# Patient Record
Sex: Male | Born: 1990 | Race: Black or African American | Hispanic: No | Marital: Single | Smoking: Never smoker
Health system: Southern US, Community
[De-identification: ages and names within clinical notes are randomized; demographics above are authoritative.]

## PROBLEM LIST (undated history)

## (undated) DIAGNOSIS — R7303 Prediabetes: Secondary | ICD-10-CM

## (undated) DIAGNOSIS — F909 Attention-deficit hyperactivity disorder, unspecified type: Secondary | ICD-10-CM

## (undated) DIAGNOSIS — F640 Transsexualism: Secondary | ICD-10-CM

## (undated) DIAGNOSIS — IMO0001 Reserved for inherently not codable concepts without codable children: Secondary | ICD-10-CM

## (undated) DIAGNOSIS — Z79899 Other long term (current) drug therapy: Secondary | ICD-10-CM

## (undated) HISTORY — PX: ANTERIOR CRUCIATE LIGAMENT REPAIR: SHX115

## (undated) HISTORY — DX: Attention-deficit hyperactivity disorder, unspecified type: F90.9

## (undated) HISTORY — DX: Morbid (severe) obesity due to excess calories: E66.01

## (undated) HISTORY — DX: Prediabetes: R73.03

## (undated) HISTORY — DX: Reserved for inherently not codable concepts without codable children: IMO0001

## (undated) HISTORY — DX: Other long term (current) drug therapy: Z79.899

## (undated) HISTORY — DX: Transsexualism: F64.0

---

## 2019-10-06 ENCOUNTER — Other Ambulatory Visit: Payer: Self-pay

## 2019-10-06 ENCOUNTER — Encounter: Payer: Self-pay | Admitting: Family Medicine

## 2019-10-06 ENCOUNTER — Ambulatory Visit (INDEPENDENT_AMBULATORY_CARE_PROVIDER_SITE_OTHER): Payer: Managed Care, Other (non HMO) | Admitting: Family Medicine

## 2019-10-06 VITALS — BP 110/70 | HR 94 | Ht 65.0 in | Wt 243.0 lb

## 2019-10-06 DIAGNOSIS — R0602 Shortness of breath: Secondary | ICD-10-CM | POA: Diagnosis not present

## 2019-10-06 DIAGNOSIS — F909 Attention-deficit hyperactivity disorder, unspecified type: Secondary | ICD-10-CM | POA: Insufficient documentation

## 2019-10-06 DIAGNOSIS — T59811A Toxic effect of smoke, accidental (unintentional), initial encounter: Secondary | ICD-10-CM | POA: Diagnosis not present

## 2019-10-06 DIAGNOSIS — Z79899 Other long term (current) drug therapy: Secondary | ICD-10-CM

## 2019-10-06 DIAGNOSIS — F640 Transsexualism: Secondary | ICD-10-CM

## 2019-10-06 DIAGNOSIS — IMO0001 Reserved for inherently not codable concepts without codable children: Secondary | ICD-10-CM

## 2019-10-06 DIAGNOSIS — R7303 Prediabetes: Secondary | ICD-10-CM | POA: Insufficient documentation

## 2019-10-06 DIAGNOSIS — Z113 Encounter for screening for infections with a predominantly sexual mode of transmission: Secondary | ICD-10-CM

## 2019-10-06 DIAGNOSIS — R0789 Other chest pain: Secondary | ICD-10-CM
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
## 2019-10-07 LAB — CBC WITH DIFFERENTIAL/PLATELET
Basophils Absolute: 0.1 10*3/uL (ref 0.0–0.2)
Basos: 1 %
EOS (ABSOLUTE): 0.3 10*3/uL (ref 0.0–0.4)
Eos: 4 %
Hematocrit: 46.9 % (ref 37.5–51.0)
Hemoglobin: 15.7 g/dL (ref 13.0–17.7)
Immature Grans (Abs): 0 10*3/uL (ref 0.0–0.1)
Immature Granulocytes: 0 %
Lymphocytes Absolute: 2.8 10*3/uL (ref 0.7–3.1)
Lymphs: 36 %
MCH: 30.3 pg (ref 26.6–33.0)
MCHC: 33.5 g/dL (ref 31.5–35.7)
MCV: 91 fL (ref 79–97)
Monocytes Absolute: 0.7 10*3/uL (ref 0.1–0.9)
Monocytes: 9 %
Neutrophils Absolute: 4.1 10*3/uL (ref 1.4–7.0)
Neutrophils: 50 %
Platelets: 282 10*3/uL (ref 150–450)
RBC: 5.18 x10E6/uL (ref 4.14–5.80)
RDW: 12.2 % (ref 11.6–15.4)
WBC: 8 10*3/uL (ref 3.4–10.8)

## 2019-10-07 LAB — HEMOGLOBIN A1C
Est. average glucose Bld gHb Est-mCnc: 111 mg/dL
Hgb A1c MFr Bld: 5.5 % (ref 4.8–5.6)

## 2019-10-07 LAB — COMPREHENSIVE METABOLIC PANEL
ALT: 23 IU/L (ref 0–44)
AST: 22 IU/L (ref 0–40)
Albumin/Globulin Ratio: 1.2 (ref 1.2–2.2)
Albumin: 4.2 g/dL (ref 4.1–5.2)
Alkaline Phosphatase: 68 IU/L (ref 44–121)
BUN/Creatinine Ratio: 8 — ABNORMAL LOW (ref 9–20)
BUN: 9 mg/dL (ref 6–20)
Bilirubin Total: 0.6 mg/dL (ref 0.0–1.2)
CO2: 22 mmol/L (ref 20–29)
Calcium: 9.4 mg/dL (ref 8.7–10.2)
Chloride: 103 mmol/L (ref 96–106)
Creatinine, Ser: 1.16 mg/dL (ref 0.76–1.27)
GFR calc Af Amer: 97 mL/min/{1.73_m2} (ref >59)
GFR calc non Af Amer: 84 mL/min/{1.73_m2} (ref >59)
Globulin, Total: 3.5 g/dL (ref 1.5–4.5)
Glucose: 100 mg/dL — ABNORMAL HIGH (ref 65–99)
Potassium: 4.6 mmol/L (ref 3.5–5.2)
Sodium: 139 mmol/L (ref 134–144)
Total Protein: 7.7 g/dL (ref 6.0–8.5)

## 2019-10-07 LAB — LIPID PANEL
Chol/HDL Ratio: 3.5 ratio (ref 0.0–5.0)
Cholesterol, Total: 190 mg/dL (ref 100–199)
HDL: 54 mg/dL (ref >39)
LDL Chol Calc (NIH): 122 mg/dL — ABNORMAL HIGH (ref 0–99)
Triglycerides: 78 mg/dL (ref 0–149)
VLDL Cholesterol Cal: 14 mg/dL (ref 5–40)

## 2019-10-07 LAB — TSH: TSH: 1.34 u[IU]/mL (ref 0.450–4.500)

## 2019-10-07 LAB — T4, FREE: Free T4: 1.34 ng/dL (ref 0.82–1.77)

## 2019-10-07 LAB — RPR: RPR Ser Ql: NONREACTIVE

## 2019-10-07 LAB — TESTOSTERONE: Testosterone: 471 ng/dL (ref 264–916)

## 2019-10-07 LAB — HIV ANTIBODY (ROUTINE TESTING W REFLEX): HIV Screen 4th Generation wRfx: NONREACTIVE

## 2019-10-08 NOTE — Progress Notes (Signed)
His labs are fine and his testosterone is 471. I will go ahead and refill this since he is due for it Monday. Please call and let him know so he can medicate himself today. I will be in touch when I have other results. No sign of diabetes and negative for HIV and syphilis.

## 2019-10-09 LAB — ESTROGENS, TOTAL: Estrogen: 208 pg/mL (ref 56–213)

## 2019-10-09 NOTE — Progress Notes (Signed)
Before I send in testosterone refill, I would like to make sure he is not having any breakthrough vaginal bleeding. I tried to call but did not leave voicemail.

## 2019-10-10 LAB — GC/CHLAMYDIA PROBE AMP
Chlamydia trachomatis, NAA: NEGATIVE
Neisseria Gonorrhoeae by PCR: NEGATIVE

## 2019-10-10 LAB — TRICHOMONAS VAGINALIS, PROBE AMP: Trich vag by NAA: NEGATIVE

## 2019-10-11 ENCOUNTER — Encounter: Payer: Self-pay | Admitting: Internal Medicine

## 2019-10-18 ENCOUNTER — Other Ambulatory Visit: Payer: Self-pay

## 2019-10-18 NOTE — Telephone Encounter (Signed)
New patient called requesting a refill on his testosterone cypionate. Patient pharmacy is [phone]

## 2019-10-18 NOTE — Telephone Encounter (Signed)
Left message for pt to call me back 

## 2019-10-18 NOTE — Telephone Encounter (Signed)
Please call and ask him the questions we needed from his previous visit (any vaginal bleeding?)

## 2019-10-23 NOTE — Telephone Encounter (Signed)
Left message for pt to call back  °

## 2019-10-26 NOTE — Telephone Encounter (Signed)
Please deny this as patient has not got back in touch with me after multple calls

## 2019-11-01 ENCOUNTER — Encounter: Payer: Managed Care, Other (non HMO) | Admitting: Family Medicine

## 2019-11-01 ENCOUNTER — Encounter: Payer: Self-pay | Admitting: Family Medicine

## 2019-11-01 ENCOUNTER — Telehealth: Payer: Self-pay | Admitting: Family Medicine

## 2019-11-01 NOTE — Telephone Encounter (Signed)
Requested records received from [facility].

## 2019-11-06 ENCOUNTER — Encounter: Payer: Self-pay | Admitting: Family Medicine

## 2020-04-22 ENCOUNTER — Emergency Department (HOSPITAL_COMMUNITY): Payer: Managed Care, Other (non HMO)

## 2020-04-22 ENCOUNTER — Encounter (HOSPITAL_COMMUNITY): Payer: Self-pay | Admitting: Emergency Medicine

## 2020-04-22 ENCOUNTER — Other Ambulatory Visit: Payer: Self-pay

## 2020-04-22 ENCOUNTER — Emergency Department (HOSPITAL_COMMUNITY)
Admission: EM | Admit: 2020-04-22 | Discharge: 2020-04-23 | Disposition: A | Discharge status: Home / Self Care | Payer: Managed Care, Other (non HMO) | Attending: Emergency Medicine | Admitting: Emergency Medicine

## 2020-04-22 DIAGNOSIS — Y92410 Unspecified street and highway as the place of occurrence of the external cause: Secondary | ICD-10-CM | POA: Insufficient documentation

## 2020-04-22 DIAGNOSIS — S2020XA Contusion of thorax, unspecified, initial encounter: Secondary | ICD-10-CM | POA: Insufficient documentation

## 2020-04-22 DIAGNOSIS — S299XXA Unspecified injury of thorax, initial encounter: Secondary | ICD-10-CM | POA: Diagnosis present

## 2020-04-22 DIAGNOSIS — S8002XA Contusion of left knee, initial encounter: Secondary | ICD-10-CM | POA: Diagnosis not present

## 2020-04-22 DIAGNOSIS — R7303 Prediabetes: Secondary | ICD-10-CM | POA: Diagnosis not present

## 2020-04-22 LAB — COMPREHENSIVE METABOLIC PANEL
ALT: 33 U/L (ref 0–44)
AST: 30 U/L (ref 15–41)
Albumin: 4.2 g/dL (ref 3.5–5.0)
Alkaline Phosphatase: 67 U/L (ref 38–126)
Anion gap: 11 (ref 5–15)
BUN: 6 mg/dL (ref 6–20)
CO2: 25 mmol/L (ref 22–32)
Calcium: 9.5 mg/dL (ref 8.9–10.3)
Chloride: 98 mmol/L (ref 98–111)
Creatinine, Ser: 1.32 mg/dL — ABNORMAL HIGH (ref 0.61–1.24)
GFR, Estimated: >60 mL/min (ref >60)
Glucose, Bld: 86 mg/dL (ref 70–99)
Potassium: 3.5 mmol/L (ref 3.5–5.1)
Sodium: 134 mmol/L — ABNORMAL LOW (ref 135–145)
Total Bilirubin: 1.4 mg/dL — ABNORMAL HIGH (ref 0.3–1.2)
Total Protein: 8.4 g/dL — ABNORMAL HIGH (ref 6.5–8.1)

## 2020-04-22 LAB — CBC WITH DIFFERENTIAL/PLATELET
Abs Immature Granulocytes: 0.04 10*3/uL (ref 0.00–0.07)
Basophils Absolute: 0.1 10*3/uL (ref 0.0–0.1)
Basophils Relative: 0 %
Eosinophils Absolute: 0.1 10*3/uL (ref 0.0–0.5)
Eosinophils Relative: 1 %
HCT: 48.6 % (ref 39.0–52.0)
Hemoglobin: 16.9 g/dL (ref 13.0–17.0)
Immature Granulocytes: 0 %
Lymphocytes Relative: 25 %
Lymphs Abs: 3.3 10*3/uL (ref 0.7–4.0)
MCH: 31.2 pg (ref 26.0–34.0)
MCHC: 34.8 g/dL (ref 30.0–36.0)
MCV: 89.7 fL (ref 80.0–100.0)
Monocytes Absolute: 1.2 10*3/uL — ABNORMAL HIGH (ref 0.1–1.0)
Monocytes Relative: 9 %
Neutro Abs: 8.4 10*3/uL — ABNORMAL HIGH (ref 1.7–7.7)
Neutrophils Relative %: 65 %
Platelets: 293 10*3/uL (ref 150–400)
RBC: 5.42 MIL/uL (ref 4.22–5.81)
RDW: 12.8 % (ref 11.5–15.5)
WBC: 13 10*3/uL — ABNORMAL HIGH (ref 4.0–10.5)
nRBC: 0 % (ref 0.0–0.2)

## 2020-04-22 LAB — I-STAT BETA HCG BLOOD, ED (MC, WL, AP ONLY): I-stat hCG, quantitative: <5 m[IU]/mL (ref <5)

## 2020-04-22 MED ORDER — IOHEXOL 300 MG/ML  SOLN
100.0000 mL | Freq: Once | INTRAMUSCULAR | Status: AC | PRN
  Administered 2020-04-22: 100 mL via INTRAVENOUS

## 2020-04-22 MED ORDER — IBUPROFEN 800 MG PO TABS: 800.0000 mg | ORAL_TABLET | Freq: Three times a day (TID) | ORAL | 0 refills | Status: AC

## 2020-04-22 MED ORDER — IBUPROFEN 800 MG PO TABS
800.0000 mg | ORAL_TABLET | Freq: Once | ORAL | Status: AC
  Administered 2020-04-23: 800 mg via ORAL
  Filled 2020-04-22: qty 1

## 2020-04-22 MED ORDER — METHOCARBAMOL 500 MG PO TABS: 500.0000 mg | ORAL_TABLET | Freq: Two times a day (BID) | ORAL | 0 refills | Status: AC

## 2020-04-22 MED ORDER — METHOCARBAMOL 500 MG PO TABS
500.0000 mg | ORAL_TABLET | Freq: Once | ORAL | Status: DC
  Filled 2020-04-22: qty 1
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
## 2020-04-22 NOTE — ED Notes (Signed)
Lisa, PA at bedside
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
## 2020-04-22 NOTE — Discharge Instructions (Signed)
Your imaging was all normal-- no bony or internal injuries seen. You will likely be sore for the next few days which is normal. Follow-up with your primary care doctor. Return here for new concerns.
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
